# Patient Record
Sex: Male | Born: 1945 | Race: White | Hispanic: No | Marital: Married
Health system: Southern US, Community
[De-identification: ages and names within clinical notes are randomized; demographics above are authoritative.]

## PROBLEM LIST (undated history)

## (undated) DIAGNOSIS — E7800 Pure hypercholesterolemia, unspecified: Secondary | ICD-10-CM

## (undated) DIAGNOSIS — I351 Nonrheumatic aortic (valve) insufficiency: Secondary | ICD-10-CM

## (undated) DIAGNOSIS — G459 Transient cerebral ischemic attack, unspecified: Secondary | ICD-10-CM

## (undated) DIAGNOSIS — F329 Major depressive disorder, single episode, unspecified: Secondary | ICD-10-CM

## (undated) DIAGNOSIS — I10 Essential (primary) hypertension: Secondary | ICD-10-CM

## (undated) DIAGNOSIS — F32A Depression, unspecified: Secondary | ICD-10-CM

## (undated) HISTORY — DX: Nonrheumatic aortic (valve) insufficiency: I35.1

## (undated) HISTORY — DX: Depression, unspecified: F32.A

## (undated) HISTORY — DX: Transient cerebral ischemic attack, unspecified: G45.9

## (undated) HISTORY — DX: Pure hypercholesterolemia, unspecified: E78.00

## (undated) HISTORY — DX: Major depressive disorder, single episode, unspecified: F32.9

## (undated) HISTORY — DX: Essential (primary) hypertension: I10

---

## 2001-12-22 ENCOUNTER — Encounter: Payer: Self-pay | Admitting: Anesthesiology

## 2001-12-29 ENCOUNTER — Encounter: Payer: Self-pay | Admitting: Orthopedic Surgery

## 2001-12-29 ENCOUNTER — Observation Stay (HOSPITAL_COMMUNITY): Admission: RE | Admit: 2001-12-29 | Discharge: 2001-12-30 | Payer: Self-pay | Admitting: Orthopedic Surgery

## 2003-05-17 ENCOUNTER — Emergency Department (HOSPITAL_COMMUNITY): Admission: EM | Admit: 2003-05-17 | Discharge: 2003-05-17 | Payer: Self-pay | Admitting: Emergency Medicine

## 2003-05-21 ENCOUNTER — Ambulatory Visit (HOSPITAL_COMMUNITY): Admission: RE | Admit: 2003-05-21 | Discharge: 2003-05-21 | Payer: Self-pay | Admitting: Neurology

## 2004-08-10 ENCOUNTER — Observation Stay (HOSPITAL_COMMUNITY): Admission: RE | Admit: 2004-08-10 | Discharge: 2004-08-11 | Payer: Self-pay | Admitting: Urology

## 2004-10-09 ENCOUNTER — Ambulatory Visit (HOSPITAL_BASED_OUTPATIENT_CLINIC_OR_DEPARTMENT_OTHER): Admission: RE | Admit: 2004-10-09 | Discharge: 2004-10-09 | Payer: Self-pay | Admitting: Urology

## 2004-10-09 ENCOUNTER — Ambulatory Visit (HOSPITAL_COMMUNITY): Admission: RE | Admit: 2004-10-09 | Discharge: 2004-10-09 | Payer: Self-pay | Admitting: Urology

## 2007-01-08 ENCOUNTER — Ambulatory Visit: Payer: Self-pay | Admitting: Hematology & Oncology

## 2007-01-08 ENCOUNTER — Inpatient Hospital Stay (HOSPITAL_COMMUNITY): Admission: EM | Admit: 2007-01-08 | Discharge: 2007-02-08 | Payer: Self-pay | Admitting: Emergency Medicine

## 2007-01-09 ENCOUNTER — Encounter (INDEPENDENT_AMBULATORY_CARE_PROVIDER_SITE_OTHER): Payer: Self-pay | Admitting: Emergency Medicine

## 2007-01-18 ENCOUNTER — Encounter (INDEPENDENT_AMBULATORY_CARE_PROVIDER_SITE_OTHER): Payer: Self-pay | Admitting: Interventional Cardiology

## 2007-01-27 ENCOUNTER — Ambulatory Visit: Payer: Self-pay | Admitting: Vascular Surgery

## 2007-01-27 ENCOUNTER — Encounter (INDEPENDENT_AMBULATORY_CARE_PROVIDER_SITE_OTHER): Payer: Self-pay | Admitting: Orthopedic Surgery

## 2007-01-31 ENCOUNTER — Ambulatory Visit: Payer: Self-pay | Admitting: Gastroenterology

## 2007-02-08 ENCOUNTER — Ambulatory Visit: Payer: Self-pay | Admitting: Psychiatry

## 2007-03-01 ENCOUNTER — Ambulatory Visit: Payer: Self-pay | Admitting: Hematology & Oncology

## 2007-03-17 LAB — CBC WITH DIFFERENTIAL/PLATELET
Basophils Absolute: 0 10*3/uL (ref 0.0–0.1)
EOS%: 4 % (ref 0.0–7.0)
Eosinophils Absolute: 0.3 10*3/uL (ref 0.0–0.5)
HGB: 13.4 g/dL (ref 13.0–17.1)
MCHC: 34.3 g/dL (ref 32.0–35.9)
MCV: 88.5 fL (ref 81.6–98.0)
MONO#: 0.6 10*3/uL (ref 0.1–0.9)
NEUT#: 4.7 10*3/uL (ref 1.5–6.5)
WBC: 6.4 10*3/uL (ref 4.0–10.0)

## 2007-03-17 LAB — FERRITIN: Ferritin: 1224 ng/mL — ABNORMAL HIGH (ref 22–322)

## 2007-03-17 LAB — CHCC SMEAR

## 2007-05-20 ENCOUNTER — Inpatient Hospital Stay (HOSPITAL_COMMUNITY): Admission: EM | Admit: 2007-05-20 | Discharge: 2007-05-22 | Payer: Self-pay | Admitting: Emergency Medicine

## 2009-10-18 IMAGING — CT CT ABDOMEN W/O CM
2 of 5 series · 16 of 46 positions shown, 18 images · IV contrast (agent unspecified)
Comparison: 01/19/2007 and 01/15/2007

CLINICAL DATA: Alcoholic intoxication with fracture of the right femur.  Repair 01/09/2007.  Continues to have anemia. EGD showed no evidence of bleeding.  Dark stools 02/02/2007. 
ABDOMEN CT WITHOUT CONTRAST:
TECHNIQUE: Multidetector CT imaging of the abdomen was performed following the standard protocol without IV contrast.
TECHNIQUE: Multidetector CT imaging of the pelvis was performed following the standard protocol without IV contrast.

[Series 4: abd/pelv w/o 5.0 b31f st · axial · non-contrast · 0.85mm/px · z∈[-514,-64]mm · 13 of 102 slices shown, 15 images]
[im 6/102  soft-tissue]
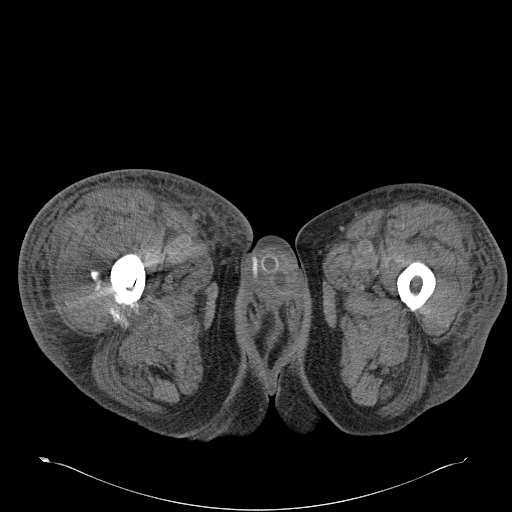
[im 6/102  bone]
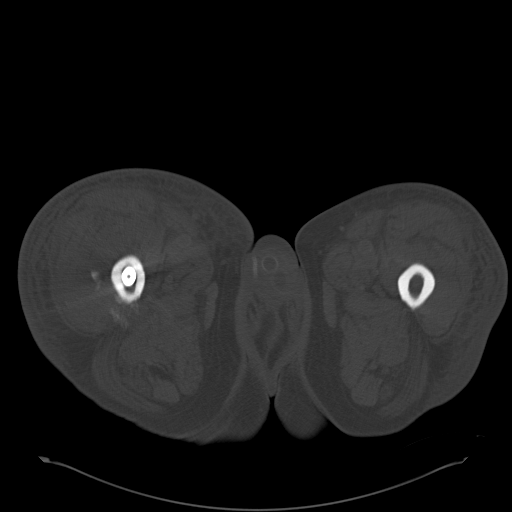
[im 12/102  soft-tissue]
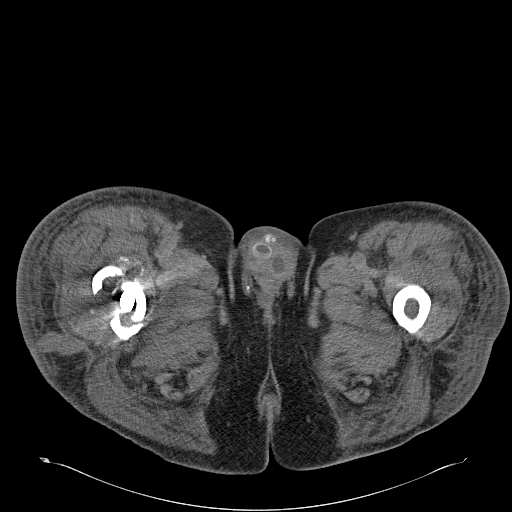
[im 23/102  soft-tissue]
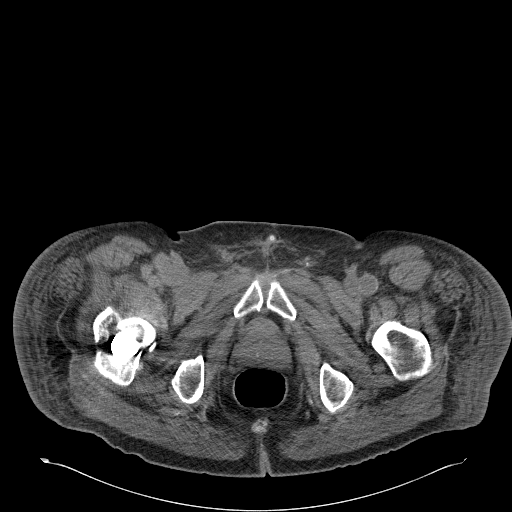
[im 29/102  soft-tissue]
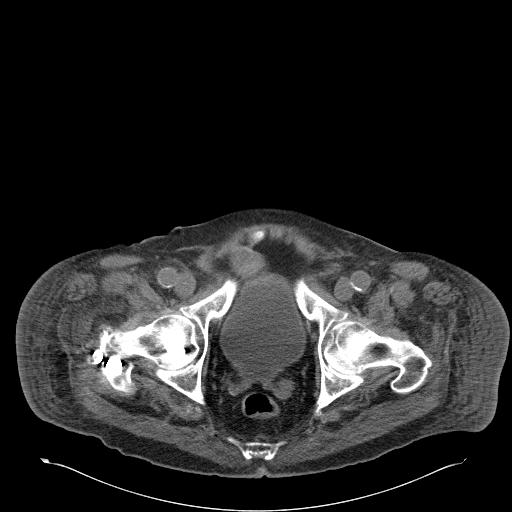
[im 34/102  soft-tissue]
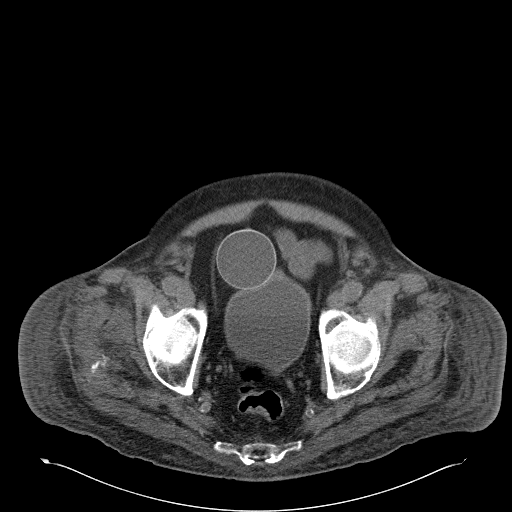
[im 45/102  soft-tissue]
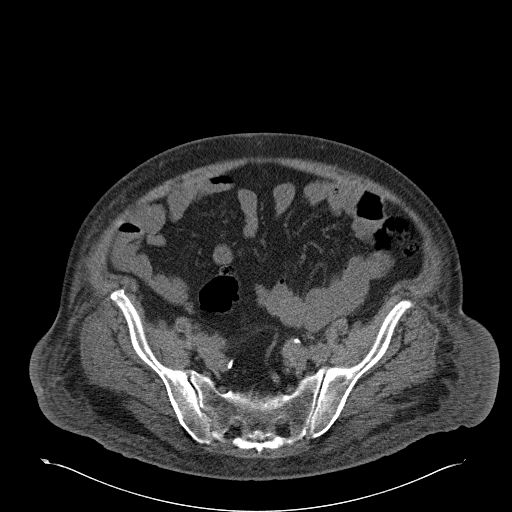
[im 51/102  soft-tissue]
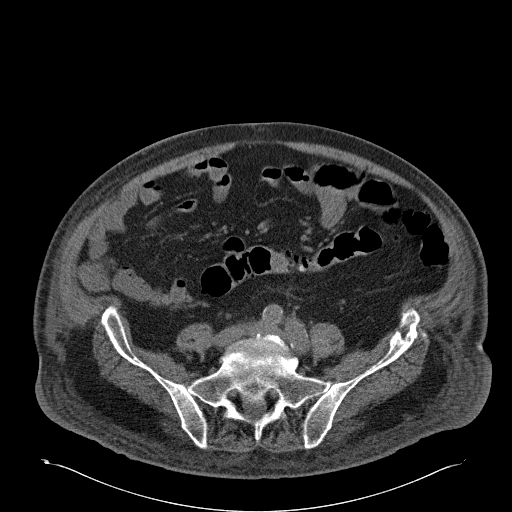
[im 57/102  soft-tissue]
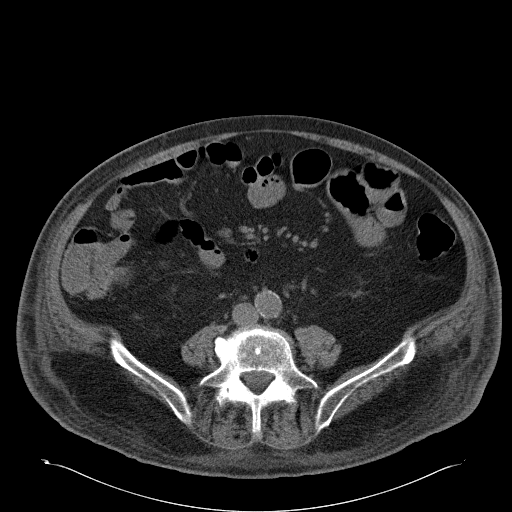
[im 68/102  soft-tissue]
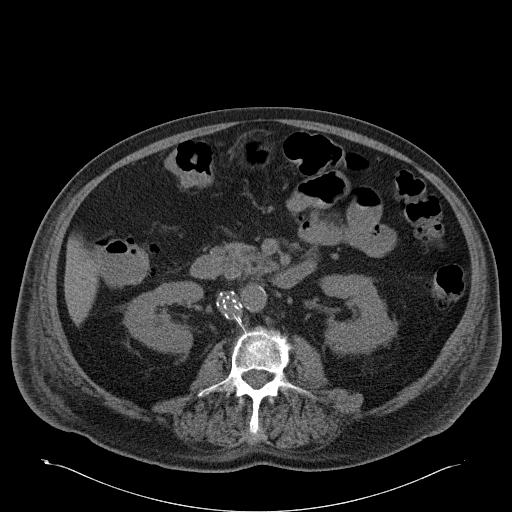
[im 68/102  bone]
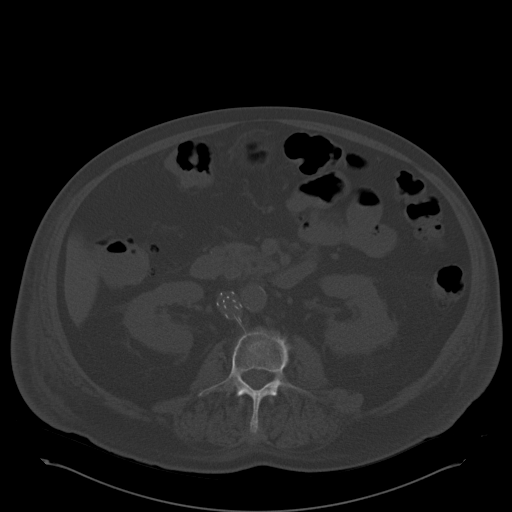
[im 73/102  soft-tissue]
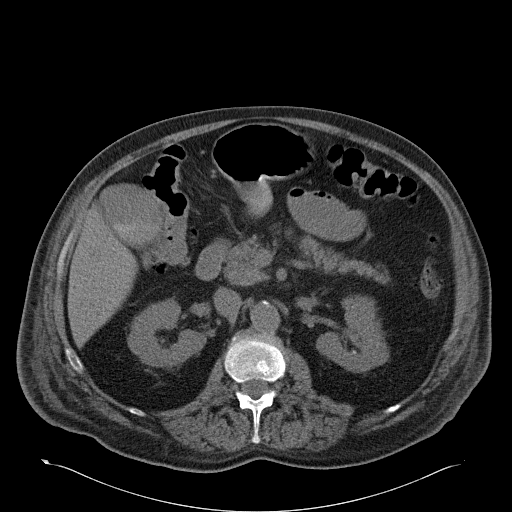
[im 79/102  soft-tissue]
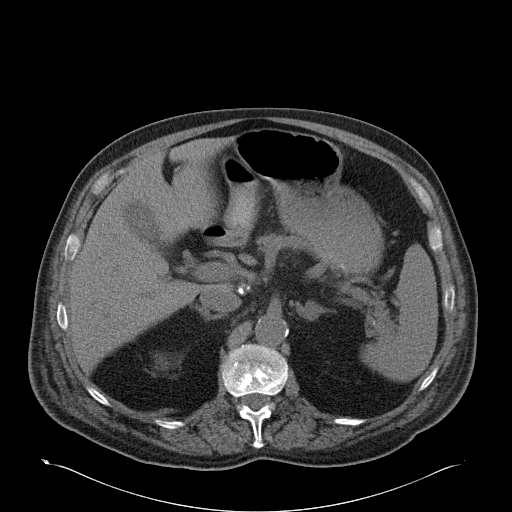
[im 90/102  soft-tissue]
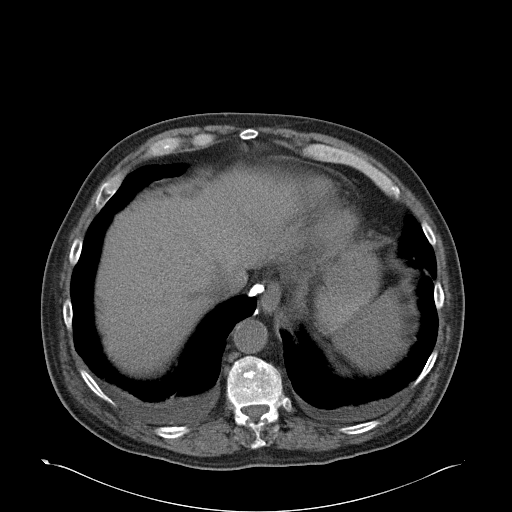
[im 96/102  soft-tissue]
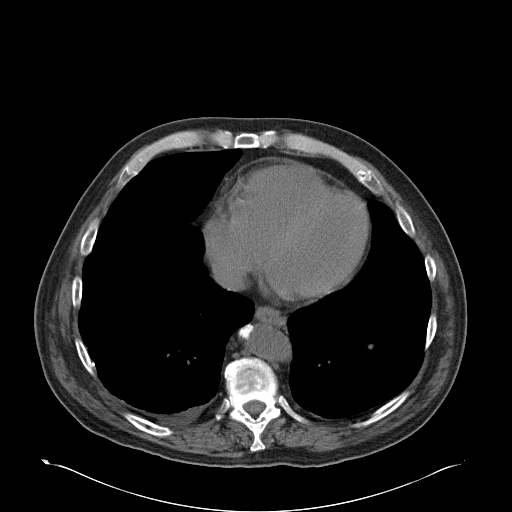

[Series 602: coronal abd/pel · coronal · 1.23mm/px · 3 of 157 slices shown]
[im 53/157  soft-tissue]
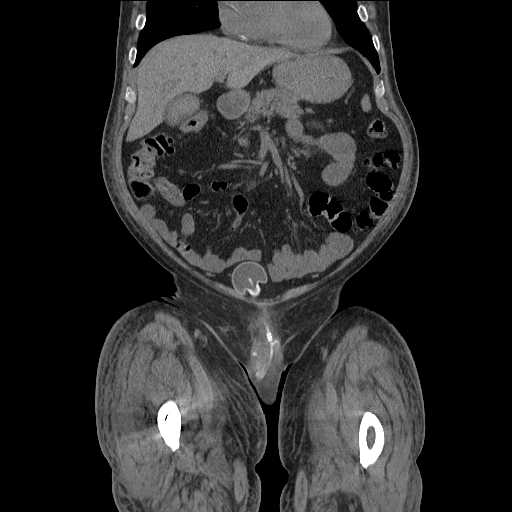
[im 70/157  soft-tissue]
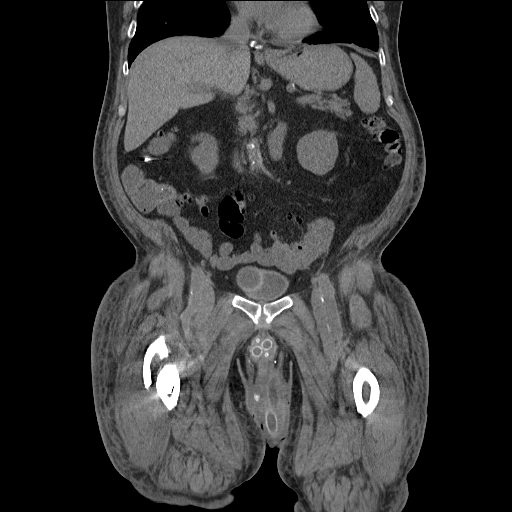
[im 87/157  soft-tissue]
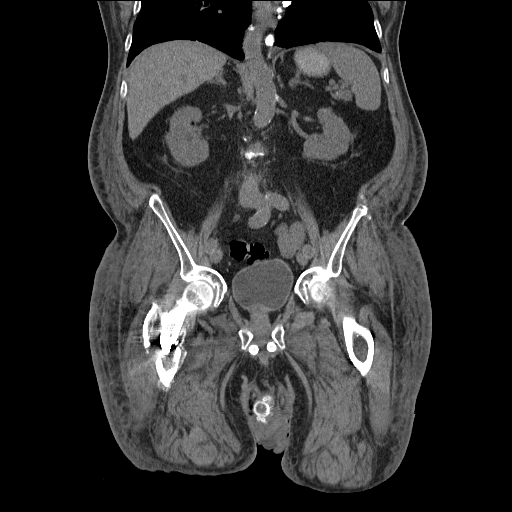

[16 of 46 positions shown; findings below may reference images not displayed]

FINDINGS: Lung bases reveal evidence of prior granulomatous exposure with calcified hilar and mediastinal lymph nodes as well as granulomas.  There are also several small noncalcified nodules, which may represent granulomas.   Pleural thickening with associated calcifications. Stability can be confirmed on followup.  Small bilateral pleural effusions. 
Dense material is located within the gallbladder.  Gallbladder wall thickening cannot be excluded.  If there are any symptoms of primary gallbladder abnormality, ultrasound may be considered for further delineation. Unenhanced imaging of the liver, right adrenal gland, kidneys, spleen, and pancreas are unremarkable.  Nodular prominence left adrenal gland.  Status post inferior vena cava filter placement.  Atherosclerotic-type changes of the aorta. 
Within the stomach, there are two radiopaque structures and within the distal ascending colon is a single linear radiopaque structure.  These were not seen on plain film exam of 01/13/2007 but are seen on plain film examination of 01/31/2007 and are suggestive of ingested material. A call is into the floor/[REDACTED].  
Scattered colonic diverticula.  No findings of bowel obstruction.  Degenerative changes with Schmorl?s node deformities throughout the lumbar spine.  No retroperitoneal or upper abdominal abnormal fluid collection/hematoma.
IMPRESSION: 1.  Two radiopaque structures within the stomach and a single linear radiopaque structure within the distal ascending colon suggestive of ingested material.  The exact etiology is indeterminate.  
2.  Small bilateral pleural effusions and granulomatous changes lung bases as described above.
3.  Dense material within dependent portion of gallbladder.  This may represent sludge.  If there is any clinical suspicion or primary gallbladder abnormality, ultrasound may be considered.
4.  Diverticula without CT evidence of diverticulitis.
5.  No evidence of retroperitoneal hematoma. 
PELVIS CT WITHOUT CONTRAST:
FINDINGS: Penile prosthesis in place.  Fracture right intertrochanteric region treated with open reduction and internal fixation with slight seperation of fracture fragments.  There is some blood surrounding the fracture site but without large hematoma.
IMPRESSION: Postsurgical changes of right femur with some blood surrounding fracture site but without a large hematoma.  Mild third spacing of fluid.

## 2010-06-30 NOTE — Consult Note (Signed)
NAME:  Ernest Donovan, Ernest Donovan NO.:  [id number]   MEDICAL RECORD NO.:  [id number]          PATIENT TYPE:  INP   LOCATION:  1429                         FACILITY:  [facility]   PHYSICIAN:  Mark C. Vernie Ammons, M.D.  DATE OF BIRTH:  10-21-1945   DATE OF CONSULTATION:  05/20/2007  DATE OF DISCHARGE:                                 CONSULTATION   HISTORY OF PRESENT ILLNESS:  Mr. Arvelo is a very pleasant 65 year old  white male patient of Dr. Retta Diones who is seen in hospital consultation  for further evaluation of a complex hydrocele.  The patient had a right  hip fracture and was undergoing physical therapy.  About 2 weeks ago, he  felt discomfort in his left hemiscrotum region and thought he had pulled  something.  He got better, but then reoccurred about a week ago with  some swelling which then resolved and then recurred again this past  Wednesday.  He contacted Dr. Lenoria Chime office and scheduled an  appointment for the following Wednesday, but the pain and swelling  became worse and he ended up coming to the emergency room.  He was seen  there and found to have an elevated white count of 20,000 and a scrotal  ultrasound showed epididymitis with a loculated hydrocele.  I was  contacted regarding the loculated hydrocele.  The pain is moderate to  severe and is worsened by movement and pressure in that location.  Not  associated with any change in his voiding pattern.  He denies dysuria or  hematuria.  He has no prior history of epididymitis or prostatitis.  He  does have a history of erectile dysfunction which has been managed with  an inflatable penile prosthesis that has required revision back in  August 2006.  It is functioning properly at this time.   PAST MEDICAL HISTORY:  1. Hypertension.  2. Recent hip fracture.   SURGICAL HISTORY:  1. Open reduction and fixation of right femoral fracture.  2. Placement of inflatable penile prosthesis with revision of that in  June 2006.   ALLERGIES:  NKDA.   MEDICATIONS:  1. Wellbutrin.  2. Amlodipine.  3. Hydrochlorothiazide.  4. Metoprolol.  5. Ramipril.  6. Topamax.  7. Campral.  8. Naltrexone.   SOCIAL HISTORY:  He is married and has two daughters.  He denies tobacco  use currently, but used to smoke 23 years ago.   FAMILY HISTORY:  Positive for prostate cancer and heart disease.   REVIEW OF SYSTEMS:  As noted above, otherwise, his entire review of  systems is negative.   PHYSICAL EXAMINATION:  VITAL SIGNS:  Blood pressure 100/62, pulse 71,  respiration 18, temperature 98.3.  GENERAL:  The patient is well-developed, well-nourished white male in no  apparent distress in mild distress.  HEENT:  Atraumatic, normocephalic.  Oropharynx clear.  NECK:  Supple with midline trachea.  CHEST:  Reveals normal respiratory effort.  CARDIOVASCULAR:  Regular rate and rhythm.  ABDOMEN:  Soft and nontender without mass or HSM.  He has a normal male  phallus with an inflatable penile prosthesis in place,  it is nontender  and in good position.  The pump as noted in the nearly midline, but just  slightly to the right side in the anterior scrotum.  The left scrotal  wall is mildly erythematous, mildly wet with mild edema.  There is some  slight hydrocele fluid palpable.  Both testicles are palpably normal and  the epididymis on the right is normal.  The epididymis on the left is  markedly tender and enlarged.  He has normal anus and perineum.  His  skin is warm and dry.  EXTREMITIES:  Without clubbing, cyanosis, edema.  He is alert and  oriented with appropriate mood and affect, has no gross focal neurologic  deficits.   LABORATORY DATA:  White blood cell count is 20.2, urinalysis is clear.  Creatinine is 1.02.   Scrotal ultrasound images were reviewed.  The testis appear normal  bilaterally.  The epididymis on the left has increased blood flow and is  enlarged with a loculated hydrocele but no significant  debris is noted  within the hydrocele fluid.   PROCEDURE:  Using sterile technique, anterior aspect of the left  hemiscrotum was cleaned with alcohol and 2% lidocaine was then used to  infiltrate the skin and subcutaneous tissue.  I then advanced a 20 gauge  needle through this into the area that felt like a hydrocele above the  left testicle and was able to draw the small amount of fluid  (approximately 3 mL).  The fluid was amber clear and did not appear  purulent.  It was sent for culture and sensitivity as well as Gram  stain.  This procedure was well tolerated.   IMPRESSION:  1. 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  2. Epididymitis with reactive hydrocele no evidence of pyocele.   PLAN:  1. The patient is currently on intravenous Cipro while awaiting      culture results.  2. The hydrocele fluid was sent for culture and sensitivity,  3. Pain medications as needed.  4. Will follow and assist.      Mark C. Vernie Ammons, M.D.  Electronically Signed     MCO/MEDQ  D:  05/20/2007  T:  05/20/2007  Job:  [id number]

## 2010-06-30 NOTE — Op Note (Signed)
NAME:  Ernest Donovan, Ernest Donovan NO.:  [id number]   MEDICAL RECORD NO.:  [id number]          PATIENT TYPE:  INP   LOCATION:  5003                         FACILITY:  [facility]   PHYSICIAN:  Madlyn Frankel. Charlann Boxer, M.D.  DATE OF BIRTH:  1945-03-02   DATE OF PROCEDURE:  01/09/2007  DATE OF DISCHARGE:                               OPERATIVE REPORT   PREOPERATIVE DIAGNOSIS:  Initially felt to be intertrochanteric femur  fracture with subtrochanteric femur fracture.   POSTOPERATIVE DIAGNOSIS:  Right comminuted subtrochanteric femur  fracture with intertrochanteric extension.   PROCEDURE:  Open reduction internal fixation of right subtrochanteric  femur fracture using a DePuy troch entry Versanail 11 x 44 mm.  Two  proximal screws in the crisscross fashion and one distal interlock.   SURGEON:  Madlyn Frankel. Charlann Boxer, M.D.   ASSISTANT:  None.   ANESTHESIA:  General.   BLOOD LOSS:  300 mL.   DRAINS:  None.   COMPLICATIONS:  None.   INDICATIONS FOR PROCEDURE:  [name] is a 65 year old gentleman who  fell at home off of a counter top onto hard floor, sustained right hip  pain, brought to emergency room where radiographs revealed comminuted  fracture.  He was initially seen and evaluated by Dr. Beverely Low who  asked for my assistance in definitive management.  I reviewed fracture  pattern and discussed fixation techniques.  Risks, benefits discussed.  Consent obtained for this procedure.   PROCEDURE IN DETAIL:  The patient was brought to the operative theater.  Once adequate anesthesia, preoperative antibiotics, Ancef, administered,  the patient was positioned on the fracture table.  The left leg was  flexed and abducted out of way with bony prominence padded, peroneal  post was padded.  The right foot was then placed in the traction shoe.  The fluoroscopic imaging now brought into the field.  Under fluoroscopic  guidance the fracture was identified.  With traction I now identified  this was more of a subtrochanteric sure with intertroch extension.   Initially my plan was to use the trochanter nail based on the initial  diagnosis of this being an intertrochanteric femur fracture.   At this point the right hip was prepped and draped in a sterile fashion.  Landmarks identified under fluoroscopy imaging and the lateral posterior  incision was made.  Guidewire was then centered in the tip of the  trochanter under AP and lateral fluoroscopic imaging.  I was able to  pass a guidewire across the fracture site.  The troch nail had already  been opened and the proximal femur reamed for this.  Once I passed this  nail, I was very uncertain that it would provide definitive fixation  based on the tip of the nail and location of subtrochanteric fracture  pattern.   Given this, I decided not to use this implant.  The implant was removed  over the guidewire to not lose reduction.  I then used a wire passing  tube and exchanged out to a ball-tip guidewire.  This was done under  fluoroscopic imaging to confirm the fracture reduction was  not lost.  At  this point I used flexible reamers and reamed up to a 12.5 reamer from a  10.  I then measured the depth and determined that a 40 cm length nail  would work best.  At this point the new trochanter entry, 11 x 400 mm  nail was passed by hand across the fracture and distal.   At this point under fluoroscopic imaging, I determined the location of  my screw fixation.  I placed one 6.5 cancellous screw into the neck and  another 6.5 cortical screw from the greater trochanter to the lesser  trochanter.  With these two screws in place and confirmed location  in  AP and lateral planes, I  placed the distal interlock with the perfect  circle technique.  Final radiographs were obtained.  The jig was removed  off the proximal femur.  All wounds were irrigated the proximal wound  was closed in layers reapproximating gluteal fascia with #1  Vicryl, 2-0  Vicryl was used in the subcu layer and the remaining wounds.  Staples  were used on the skin.  The skin was cleaned, dried and dressed  sterilely with dressing sponges and ABD proximally due to some of the  bleeding that was present from ooze.  Tape was applied.  At this point  the patient was awoken from anesthesia and brought to recovery room in  stable condition.      Madlyn Frankel Charlann Boxer, M.D.  Electronically Signed     MDO/MEDQ  D:  01/09/2007  T:  01/10/2007  Job:  [id number]

## 2010-06-30 NOTE — Discharge Summary (Signed)
NAMERIELY, BASKETT NO.:  [id number]   MEDICAL RECORD NO.:  [id number]          PATIENT TYPE:  INP   LOCATION:  5023                         FACILITY:  [facility]   PHYSICIAN:  Madlyn Frankel. Charlann Boxer, M.D.  DATE OF BIRTH:  25-Feb-1945   DATE OF ADMISSION:  01/08/2007  DATE OF DISCHARGE:  02/08/2007                               DISCHARGE SUMMARY   This is an addendum to a most recent discharge summary that was last  dictated on the 19th of December.   After the 19th of December, it was determined, based on continued subtle  drops of his hemoglobin and a drop of his hemoglobin and hematocrit down  to 21.1, that the patient would stay for 2 units of blood.  On October 05, 2006, his blood was rechecked after receiving 2 units of blood, and  it was noted at that point that his hematocrit remained the same.  His  hemoglobin went up 1 point the day before, and the next day it came  down.   With this drop on the 19th, GI was reconsulted.  They did a repeat  endoscopy, showing no evidence of any bleeding actively.  They  recommended a bleeding consult.  On February 06, 2007, I had also  consulted hematology for any recommendations they may have.  They made  recommendations for removing the oral iron and starting him on IV iron.  Mr. Shahin also reported to me significant acute exacerbation of his  depression due to his prolonged hospital stay.  I had psychiatry come  by, who was kind enough to make some acute recommendations.   Again, on rechecking his labs on February 06, 2007, he had a hematocrit  of 26.6.  On February 06, 2007, his hemoglobin was 8.6, down from 9, and  has basically remained at that level since.   He had an upper endoscopy and a colonoscopy performed by GI on February 07, 2007 that was reportedly negative for any pathology.  No evidence of  active bleeding.  The bleeding scan that had been done before was  negative.   At this point, the patient is ready  for discharge.   I still think that Mr. Alesi is in significant need of rehabilitation  stay.  He has had four weeks of complete deconditioning with both upper  and lower extremity daily activities.  Though he is able to function and  walk a little bit, he is unable to climb stairs.  He does not have  control over his right lower extremity.  He has become very weak and  deconditioned with regards to activities of daily living of dressing,  bathing, and toileting, getting out of the bed easily.  Being in a  facility would also allow for potential evaluation and monitoring of his  medical issues, including history of pulmonary embolus now,  postoperative ileus, hemoglobin levels, and consistent food intake,  including protein due to very low levels of albumin and prealbumin.   DISCHARGE INSTRUCTIONS:  1. He will continue to be touchdown weightbearing on the right lower  extremity with 25% weightbearing.  He will be using a walker until      followup in the orthopedic office.  2. Orthopedically, he will follow up at [facility] at [phone]      [phone] for radiographs.  3. It is recommended that he follow up with Dr. Arlyce Dice at [facility].  They will follow up in a couple of weeks after      discharge.  4. As for medical physicians, he is to try to schedule an appointment      with [facility] for some long-term care.   DISCHARGE MEDICATIONS:  Include those previously listed.  In addition,  it will be encouraged that he is to take in protein for nutrition  purposes.  1. Medications also include folic acid 2 mg daily.  2. From an orthopedic standpoint, his medications would be Vicodin,      used at a minimum basis, 5/500, 1 tablet every 6 hours as needed      for pain, supplemented with Tylenol.  3. Robaxin 500 mg q.i.d. p.r.n. muscle spasms and pain.  4. From a psychiatric standpoint, I would recommend he go home on his      home medications with plans  to make arrangements and follow up with      his primary psychiatrist for continued monitoring of his current      state and possible need for adjustment of medications.  5. Other specific medications include hydrochlorothiazide 25 mg p.o.      daily.  6. Norvasc 5 mg p.o. daily.  7. Topamax 100 mg p.o. daily.  8. Metoprolol 50 mg p.o. daily.  9. Campral 330 mg p.o. t.i.d.  10.Multivitamins p.o. daily.  11.Thiamine 100 mg p.o. daily.  12.Protonix 40 mg p.o. b.i.d.   His wound has completely healed over the right hip.  He is free to  shower or bathe without difficulty or concern.  No staples need to be  removed.      Madlyn Frankel Charlann Boxer, M.D.  Electronically Signed     MDO/MEDQ  D:  02/08/2007  T:  02/08/2007  Job:  [id number]   cc:   Barbette Hair. Arlyce Dice, MD,FACG  Rose Phi. Myna Hidalgo, M.D.  [employer]

## 2010-06-30 NOTE — Discharge Summary (Signed)
NAMECHANTZ, MONTEFUSCO NO.:  [id number]   MEDICAL RECORD NO.:  [id number]          PATIENT TYPE:  INP   LOCATION:  5023                         FACILITY:  [facility]   PHYSICIAN:  Madlyn Frankel. Charlann Boxer, M.D.  DATE OF BIRTH:  [birth year]   DATE OF ADMISSION:  01/08/2007  DATE OF DISCHARGE:                               DISCHARGE SUMMARY   ADDENDUM:  Original planned discharge was on January 27, 2007.  Discharge medications will be adjusted a the end of the note.   On the evening of January 26, 2007, and then the morning of January 27, 2007, Mr. Onis Markoff had several bouts of nausea and emesis that  had obvious findings of blood mixed in with dark blood.  He was taken  rather urgently, due to a drop in his blood pressure to the Intensive  Care Unit.   In the Intensive Care Unit GI was reconsulted for evaluation.  He was  stabilized without any cardiac event or major complicating factors.  He  was taken to Endoscopy where no obvious bleeding or source of problems  was identified.   NG tube was replaced and maintained.  Given the fact that no active  bleeding was identified, he was taken back down to Endoscopy on January 27, 2007, where they were able to identify a small ulceration in the  proximal portion of the stomach.   At the time of his transfer to the Intensive Care Unit labs were ordered  including a D-dimer which was elevated.  This value led [place] to evaluate  his lungs with a pulmonary embolus with a CT scan.  There were findings  of a lower lobe pulmonary embolus.   Given the findings of the ulcer, the drop in his hematocrit, the  vomiting of blood in addition to the findings concerning for PE in the  lower lobe, he was taken off of his DVT prophylaxis at this point and a  temporary vena cava filter placed by Interventional Radiology.   He was further stabilized in the Intensive Care Unit where he remained  until he was transferred out of the Intensive  Care Unit January 30, 2007.  Please note that a third endoscopy was performed on January 29, 2007, after one more episode of some emesis; they were able to find an  ulcer that was coagulated and stapled off.   Physical therapy was reconsulted for touch down weight bearing on the  right lower extremity for mobility.   He was transferred to the floor on January 30, 2007, where he remained  stable, GI continued to follow.  They had signed off on the 15th with  plans to follow up with Dr. Arlyce Dice.  They kept him in the hospital over  the next few days where we continued to watch his hematocrit drop down  slowly.  He also had another bout of nausea and vomiting small amounts  of dark material.  GI was reconsulted and he was set up for an endoscopy  on February 02, 2007.  At that endoscopy there was no significant  findings, all previous treatment was identified with no active bleeding.   His hematocrit on February 03, 2007, was down to 21.1.  Based on all of  the findings and the duration out from his hip fracture, which is now  over 3 weeks, the plan was for him to receive 2 units of packed red  blood cells and possibly be transferred to [facility] for rehab with  plans for outpatient management.   Subjectively, Mr. Danese was feeling much better on the morning of  February 03, 2007, he had no complaints of abdominal discomfort, no  nausea.  His hip at this point remained stable without new events.  Radiographs had been ordered, since his time of previous discharge,  revealing stable hardware with no evidence of obvious healing, however  no complicating features.   DISCHARGE PLAN:  1. He will be discharged to [facility] for rehabilitation      purposes.  He will continue to be touch down weight bearing with      physical therapy until further directed from my office.  He will      need follow up with me in 2 weeks which can be scheduled by calling      [phone].  At  this point, he should be seen probably around the      first week of January.  2. Should also have follow up with Gastroenterology (Dr. Melvia Heaps), with [facility] in 1 week, this appointment will need to      be made.  At that point issues regarding continued hematocrit drop,      potential lower GI involvement can be reviewed.   DIET:  A regular diet.   DISCHARGE MEDICATIONS:  1. Will be as previously listed.  2. Please note that he was on Reglan in the hospital but that would be      discharged.  3. From a hip standpoint, he will be on Vicodin 1-2 tablets p.o. q.4-      6h. p.r.n. for pain, Robaxin 500 mg p.o. q.i.d. p.r.n. for muscle      spasms and pain.  Otherwise, his medications are unchanged.      Madlyn Frankel Charlann Boxer, M.D.  Electronically Signed     MDO/MEDQ  D:  02/03/2007  T:  02/03/2007  Job:  [id number]   cc:   Barbette Hair. Arlyce Dice, MD,FACG

## 2010-06-30 NOTE — Discharge Summary (Signed)
Ernest Donovan, Ernest Donovan NO.:  [id number]   MEDICAL RECORD NO.:  [id number]          PATIENT TYPE:  INP   LOCATION:  6707                         FACILITY:  [facility]   PHYSICIAN:  Madlyn Frankel. Charlann Boxer, M.D.  DATE OF BIRTH:  01/31/1946   DATE OF ADMISSION:  01/08/2007  DATE OF DISCHARGE:  01/27/2007                               DISCHARGE SUMMARY   ADMISSION DIAGNOSES:  1. Right comminuted subtrochanteric femur fracture.  2. History of hypertension.  3. History of bicuspid valve.  4. History of depression.  5. Prior alcohol use.   HISTORY OF PRESENT ILLNESS:  The patient is a 65 year old gentleman who  was home on the evening of January 08, 2007, trying to capture a bug  that was on the ceiling.  He was standing on a marble countertop,  slipped in socks and fell, first on the counter top and then on the  floor.  He had immediate onset of pain, inability to bear weight.  EMS  was called and the patient was brought to the emergency room where  radiographs revealed a subtrochanteric femur fracture.  The patient was  initially seen and evaluated by one of my partners, Almedia Balls. Ranell Patrick,  M.D., who admitted him.  He discussed the case with me for definitive  management.  He was seen and evaluated by [facility] as a  preoperative consultation and evaluation.  They recommended cardiac  evaluation based on the history of bicuspid valve and murmur and this  was obtained.   HOSPITAL COURSE:  The patient was admitted on January 08, 2007, with  right subtrochanteric femur fracture.  At that time he did have a blood  alcohol level of 61.   He was seen and evaluated by [facility] followed by cardiology  evaluation.  On January 09, 2007, the patient was cleared for surgery  after having an echocardiogram.   On January 09, 2007, he did undergo an open reduction and internal  fixation of his right subtrochanteric femur fracture.  This was an  uncomplicated procedure,  only noted for a significant amount of  narcotic/Fentanyl that was required to keep him asleep.   Immediately postoperatively he was transferred to the orthopedic floor  where he remained stable.  Routine postoperative care was initiated with  physical therapy with touchdown weightbearing based on his fracture  pattern.  He was placed on Lovenox for DVT prophylaxis immediately  postoperatively, however, thus had some issues regarding oozing on the  lateral thigh.   As his hospital stay progressed, by postoperative day #4, he was doing  fairly well.  He continued to have some serous drainage out of his  thigh.  We were doing dressing changes in b.i.d. basis.  By  postoperative day #5 it was noted that he was having some reports of  nausea and vomiting over the night.  His abdomen was distended and he  was noted to have decreased bowel sounds.  He was also noted to have a  bump in his creatinine from a presentation of 0.8 up to 1.3.   His hematocrit remained  stable throughout the hospital stay and  incidentally noted to have this bump in his creatinine at that time.   Given the findings on this day, which is now postoperative day #5, an NG  tube was placed removing significant amount of gastric contents.  Radiographically, he was diagnosed as having an ileus versus partial  small bowel obstruction.  A Foley catheter was placed to monitor his  urine output.  His IV fluids were increased due to presumed hypovolemic  acute renal insufficiency.   He was still managed and followed on the orthopedic ward until  postoperative day #6 when he had a drop in his saturations and blood  pressure.  He was subsequently transferred down to the intensive care  unit where he was in stepdown.   There was a concern about cardiac event.  Cardiology was consulted.  His  cardiac enzymes were negative for an acute myocardial infarction.  There  was no further studies and no catheterization done from a  cardiac  standpoint.   At this time also the general surgeons were consulted due to the  persistent ileus versus partial small bowel obstruction.  We appreciated  their consultation and following during his time in the unit.  He  remained in the unit from November 30 until January 21, 2007.  At that  time he was transferred up to 6700 at [facility]  He continued  to be followed by nutrition as TNA was initiated with a PICC line due to  his prolonged ileus and continued use of NG tube.  TNA was initiated on  January 20, 2007.   Through the assistance of general surgery and their daily following, it  was recognized that NG tube may have been a little bit lower than it  needed to be down into the duodenum versus gastric.  He was also started  on some Reglan.  The combination of these events indicated that the NG  tube could be removed and after clamped for a while, it was removed on  January 21, 2007.  At that point TNA was discontinued.  He tolerated the  NG being out of his stomach and having positive bowel movements, flatus,  and tolerating a progressive diet.  By January 26, 2007, the patient's  wound had healed, staples were going to be discontinued.  He had been  continued with physical therapy for touchdown weightbearing due to the  unstable nature of his fracture pattern.   The plan on January 26, 2007, was for him to be discharged to a nursing  facility on January 27, 2007, due to the fact that his daughter is an  occupational therapist in [facility], they wished to be transferred  down to that area.   DISCHARGE INSTRUCTIONS:  The patient will be discharged to be touchdown  weightbearing with physical therapy and occupational therapy to progress  to work on upper extremity strength, lower extremity strength in an  effort to be able to return back to his normal home environment that  does require stairs.  He will be on a regular diet monitoring his  bowels.   Prior to discharge, he was transitioned over to oral pain  medications.   DISCHARGE MEDICATIONS:  1. Carypral 330 mg p.o. t.i.d.  2. Naltrexone 50 mg p.o. daily.  3. Budeprion XL 150 mg three tablets q.a.m.  4. Hydrochlorothiazide 25 mg daily.  5. Norvasc 5 mg daily.  6. Ramipril 10 mg b.i.d.  7. Topamax 100 mg daily.  8. Metoprolol 50 mg daily.  9. Aspirin 325 mg enteric coated p.o. daily for four weeks.  10.Vicodin one to two tablets p.o. q.4-6 hours p.r.n. pain.  11.Robaxin 500 mg p.o. q.i.d. p.r.n. for spasm and pain.  12.Please note that some of his medications are narcotic antagonist,      therefore narcotics may not provide that much relief and he may get      more benefit from the combination of Tylenol 650 mg p.o. q.4-6      hours p.r.n. for pain.  13.Colace 100 mg p.o. b.i.d. p.r.n. for constipation.  14.MiraLax 17 grams p.o. daily p.r.n. for constipation.   I would need to see him in the office in two weeks from discharge which  would be on and around the Christmas Holiday and this appointment can be  set up through the nursing facility for him to come back to see me at 2-  3 weeks at office number [phone].   DISCHARGE DIAGNOSES:  1. Right subtrochanteric femur fracture.  2. Postoperative prolonged ileus due to narcotic use.  3. Hypertension.  4. Depression.  5. History of alcohol use.  6. History of bicuspid valve.   Staples were removed prior to discharge.  The wound had been dry and  stable with no signs of infection.      Madlyn Frankel Charlann Boxer, M.D.  Electronically Signed     MDO/MEDQ  D:  01/26/2007  T:  01/26/2007  Job:  [id number]

## 2010-06-30 NOTE — Op Note (Signed)
NAME:  Ernest Donovan, Ernest Donovan NO.:  [id number]   MEDICAL RECORD NO.:  [id number]          PATIENT TYPE:  INP   LOCATION:  2304                         FACILITY:  [facility]   PHYSICIAN:  Anselmo Rod, M.D.  DATE OF BIRTH:  July 18, 1945   DATE OF PROCEDURE:  01/29/2007  DATE OF DISCHARGE:                               OPERATIVE REPORT   PROCEDURE PERFORMED:  Esophagogastroduodenoscopy with control of  bleeding.   ENDOSCOPIST:  Anselmo Rod, M.D.   INSTRUMENT USED:  Pentax video panendoscope.   INDICATIONS FOR PROCEDURE:  65 year old white male status post right hip  fracture.  The patient had open reduction internal fixation and  subsequently developed a pulmonary embolus and an upper GI bleed.  Two  previous endoscopies have not been able to locate the source of bleeding  as there was a large amount of clots in the high cardia.   PREPROCEDURE PREPARATION:  Informed consent was procured from the  patient and the family after the risks and benefits of the procedure  were discussed with the patient in great detail.  Serial CBCs were done  and the patient's vital signs were closely monitored in ICU setting.  The patient was on IV Protonix.   PREPROCEDURE PHYSICAL:  The patient had stable vital signs.  NECK:  Supple.  Chest clear to auscultation.  S1, S2 regular.  Abdomen soft  with normal bowel sounds.  There is minimal epigastric tenderness on  palpation with no guarding, rebound or rigidity.   DESCRIPTION OF PROCEDURE:  The patient was placed in the left lateral  decubitus position and sedated with 75 mcg of Fentanyl and 5 mg of  Versed given intravenously in slow incremental doses.  Once the patient  was adequately sedated and maintained on low-flow oxygen and continuous  cardiac monitoring.  The Pentax video panendoscope was advanced through  the mouthpiece over the tongue into the esophagus under direct vision.  The entire esophagus was widely patent with no  evidence of ring,  stricture, mass, esophagitis or Barrett's mucosa.  The scope was then  advanced in the stomach.  Some NG trauma was noted in the midbody of the  stomach where some erythematous spots were recognized. A visible vessel  was noted in the high cardia along the greater curvature and three  resolution clips were applied over the vessel to achieve hemostasis  after injecting 10 cc of epinephrine around the visible vessel. There  was a small area of ulceration just proximal to the visible vessel which  may also be secondary to NG trauma.  The antrum and the mid body  appeared normal.  Proximal small bowel appeared normal as well.  One of  the resolution clips that were applied did not take but three of the  four seemed to hold well at the site of bleeding.  The patient tolerated  the procedure well without complications.   IMPRESSION:  1. Normal-appearing esophagus.  2. 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  Three      resolution clips applied to achieve hemostasis after injecting 10  mL of epinephrine around the visible vessel.  3. Evidence of NG trauma.  4. Normal proximal small bowel.   RECOMMENDATIONS:  1. Continue serial CBCs.  2. Avoid all nonsteroidals for now.  3. Change Protonix to p.o.  4. Begin a clear liquid diet.  5. Adherence to antireflux measures have been emphasized and discussed      with the patient.  6. Further recommendations made in follow-up.      Anselmo Rod, M.D.  Electronically Signed     JNM/MEDQ  D:  01/29/2007  T:  01/30/2007  Job:  [id number]   cc:   Madlyn Frankel Charlann Boxer, M.D.  Rachael Fee, MD

## 2010-06-30 NOTE — Consult Note (Signed)
NAME:  Ernest Donovan, Ernest Donovan NO.:  [id number]   MEDICAL RECORD NO.:  [id number]          PATIENT TYPE:  INP   LOCATION:  3304                         FACILITY:  [facility]   PHYSICIAN:  Corky Crafts, MDDATE OF BIRTH:  1945-09-06   DATE OF CONSULTATION:  01/17/2007  DATE OF DISCHARGE:                                 CONSULTATION   REASON FOR CONSULTATION:  Abnormal cardiac enzymes, question myocardial  infarction, tachycardia, chest pain, bicuspid aortic valve.   HISTORY OF PRESENT ILLNESS:  The patient is a 65 year old man who had a  hip fracture.  He underwent surgery.  Since surgery, he had had some  problems with hypotension and subsequent tachycardia.  He has also had  some renal insufficiency secondary to dehydration.  The dehydration was  likely caused by diarrhea.  He also had some left chest wall pain, and  cardiac enzymes revealed a troponin of 0.12. Marland Kitchen There is now a question  as to whether he has had a myocardial infarction or underlying coronary  artery disease.   Currently, the patient feels well.  He does not report any chest pain or  shortness of breath.  He has not had any further palpitations.  Overall,  he feels fairly well.  His leg is improving.   ALLERGIES:  No known drug allergies.   HOME MEDICATIONS:  Norvasc, hydrochlorothiazide, Toprol XL 50 mg daily,  Wellbutrin, Topamax, Campral, and multivitamin.   SOCIAL HISTORY:  The patient does drink alcohol occasionally, he does  not smoke.   FAMILY HISTORY:  His father had coronary artery disease.   PAST MEDICAL HISTORY:  Hypertension, bicuspid valve, depression, prior  alcohol use.   PAST SURGICAL HISTORY:  Recent hip surgery.   REVIEW OF SYSTEMS:  He has had diarrhea, he has had palpitations and  chest pain recently.  He has had leg pain.  She has had a fever and is  currently denying any shortness of breath.  All other systems negative.   PHYSICAL EXAMINATION:  VITAL SIGNS:  Blood  pressure is 120/63, heart  rate 100, respiratory rate 18.  GENERAL:  He is awake, alert, and in no apparent distress.  HEENT:  Head normocephalic, atraumatic.  Eyes:  Extraocular movements  are intact.  NECK:  No JVD, no carotid bruits.  CARDIOVASCULAR:  Regular rate and rhythm.  S1 and S2.  LUNGS:  Clear to auscultation bilaterally.  ABDOMEN:  Nondistended.  Nontender.  No mass.  EXTREMITIES:  No edema.  NEUROLOGIC:  Alert and oriented x3.  PSYCHIATRIC:  Normal mood and affect.   LABORATORY DATA:  Shows CK of 315, MB 3.0, troponin 0.04, subsequent CK  277, MB 2.5, troponin 0.12, C. difficile negative.  Creatinine 1.1,  although it was as high as 1.5.  EKG shows sinus tachycardia with right  bundle branch block.  There is some T-wave inversion anteriorly.   PLAN:  A 65 year old with abnormal cardiac enzymes and valvular heart  disease, along with hypertension and renal insufficiency.   1. Continue aggressive hydration to help with the renal insufficiency.  This could have contributed to his elevated troponin.  2. I will check an echocardiogram to evaluate LV function.  Given the      MB fraction, I doubt that this represents ruptured plaque. I would      not feel the need to anticoagulate this patient with heparin      because of his cardiac enzymes.  3. Continue to follow electrolytes, given his diarrhea.  4. Once he is improved from his hip surgery, I would consider      performing an outpatient stress test, or possibly catheterization      if he has symptoms.  5. His blood pressure is currently under control with medicines being      on hold given low blood pressures.  6. I will follow the patient while he is in the hospital.      Corky Crafts, MD  Electronically Signed     JSV/MEDQ  D:  01/17/2007  T:  01/18/2007  Job:  [id number]

## 2010-06-30 NOTE — Discharge Summary (Signed)
NAMEDIONTAE, ROUTE NO.:  [id number]   MEDICAL RECORD NO.:  [id number]          PATIENT TYPE:  INP   LOCATION:  5023                         FACILITY:  [facility]   PHYSICIAN:  Madlyn Frankel. Charlann Boxer, M.D.  DATE OF BIRTH:  1945/05/16   DATE OF ADMISSION:  01/08/2007  DATE OF DISCHARGE:                               DISCHARGE SUMMARY   ADDENDUM   ADMITTING DIAGNOSIS:  Right subtrochanteric femur fracture.   DISCHARGE DIAGNOSES:  1. Right subtrochanteric femur fracture comminuted.  2. Upper gastrointestinal bleeding with consultations with general      surgery and gastroenterology.  3. Postoperative ileus.  4. Hypertension.  5. Depression.  6. History of alcohol use.   CONSULTATIONS:  Dr. Eldridge Dace, Dr. Arlyce Dice, Dr. Daphine Deutscher and Dr. Jamey Ripa from  [facility].      Madlyn Frankel Charlann Boxer, M.D.  Electronically Signed     MDO/MEDQ  D:  02/03/2007  T:  02/03/2007  Job:  [id number]

## 2010-06-30 NOTE — H&P (Signed)
NAME:  Ernest Donovan, Ernest Donovan NO.:  [id number]   MEDICAL RECORD NO.:  [id number]          PATIENT TYPE:  INP   LOCATION:  0104                         FACILITY:  [facility]   PHYSICIAN:  Kela Millin, M.D.DATE OF BIRTH:  March 15, 1945   DATE OF ADMISSION:  05/20/2007  DATE OF DISCHARGE:                              HISTORY & PHYSICAL   PRIMARY CARE PHYSICIAN:  Dr. Henrine Screws.   UROLOGIST:  Dr. Retta Diones.   CHIEF COMPLAINT:  Testicular swelling and pain - left greater than  right.   HISTORY OF PRESENT ILLNESS:  The patient is a 65 year old white male  with past medical history significant for a penile implant,  hypertension, depression, history of bicuspid valve and prior history of  alcohol abuse, also history of hip fracture and while at rehab following  hospitalization he was diagnosed with E. coli bacteremia treated with  antibiotics in February 19, 2007.  He presents with the above complaints.  He states that he was in his usual state of health until about 2 weeks  ago when he first noted some scrotal swelling.  After a couple of days  that resolved, but 3 days ago the swelling began again and this time it  was worse than previously and also, he was having a lot of pain.  He  states that initially he thought that it was just irritation from riding  the bike at his rehab that he has continued outpatient, but with the  worsening of his symptoms he decided to come to the ER.  He denies  dysuria, abdominal pain, fevers, cough, diarrhea, melena, and no  hematochezia.   He was seen in the ER and a scrotal ultrasound was done which revealed  left epididymo-orchitis with a complex left hydrocele and also a small  and simple-appearing right hydrocele.  His blood pressure was noted to  be low at 88/51 in the ER but improved with hydration to 100/62.  His  white cell count elevated at 20.2.  He is admitted for further  evaluation and management.   PAST MEDICAL HISTORY:   As above.   MEDICATIONS:  1. Wellbutrin 150 mg three tablets q.a.m.  2. Norvasc 5 mg daily.  3. Hydrochlorothiazide 25 mg daily.  4. Metoprolol 25 mg b.i.d.  5. Campral 333 mg t.i.d.  6. Ramipril 10 mg b.i.d.  7. Topamax 100 mg daily.  8. Naltrexone 50 mg daily.   ALLERGIES:  No known drug allergies.   SOCIAL HISTORY:  He states he has not had any alcohol since January 08, 2007.  He quit tobacco in 1986.   FAMILY HISTORY:  His father had prostate cancer and also coronary artery  disease.   REVIEW OF SYSTEMS:  As per HPI, other review of systems negative.   PHYSICAL EXAMINATION:  GENERAL:  He is an elderly white male in no  apparent distress.  VITAL SIGNS:  His temperature is 98.3, blood pressure 100/62, low of  88/51 noted in the ER, pulse of 71, respiratory rate 18, O2 saturation  of 95%.  HEENT:  PERRL, EOMI, slightly dry  mucous membranes, no oral exudates.  NECK:  Supple, no adenopathy, no thyromegaly and no JVD.  LUNGS:  Clear to auscultation bilaterally.  No crackles or wheezes.  CARDIOVASCULAR:  Regular rate and rhythm.  Normal S1, S2.  ABDOMEN:  Soft, bowel sounds present, nontender, nondistended.  No  organomegaly and no masses palpable.  GU:  Testicular swelling present, left greater than right, and scrotal  erythema noted, also testicular tenderness left greater than right.  He  has a penile implant present.  EXTREMITIES:  No cyanosis and no edema.  NEURO:  He is alert and oriented x3.  Cranial nerves II-XII grossly  intact.  Nonfocal exam.   LABORATORY DATA:  Scrotal ultrasound as per HPI.  His white cell count  is 20.2, hemoglobin 13.8, hematocrit 40.2, platelet count is 203,  neutrophil count is 90.  His sodium is 133 with a potassium of 3.3,  chloride 101, CO2 is 22, glucose 117, BUN of 14. Creatinine 1.02,  calcium 8.8.  Urinalysis is negative for infection.   ASSESSMENT AND PLAN:  1. Left epididymo-orchitis with complex left hydrocele and sepsis       syndrome - will obtain blood cultures, empiric antibiotics.  As      noted above he also has a penile implant and is followed by Dr.      Retta Diones.  I have consulted urology and Dr. Vernie Ammons to see the      patient.  2. Hypotension - likely secondary to infection.  Blood cultures as      above, IV fluids, hold antihypertensives for now and follow.  3. Hypokalemia - replace potassium.  4. Hyponatremia, mild - likely secondary to volume depletion.  We will      hold hydrochlorothiazide, hydrate and follow.  5. History of hypertension - as above, will hold antihypertensives      secondary to number #2 and follow.  6. History of depression - continue outpatient medications.  7. History of hip fracture - follow and consult physical therapy.      Kela Millin, M.D.  Electronically Signed     ACV/MEDQ  D:  05/20/2007  T:  05/20/2007  Job:  [id number]   cc:   Chales Salmon. Abigail Miyamoto, M.D.  Fax: [phone]   Bertram Millard. Dahlstedt, M.D.  Fax: [phone]

## 2010-11-10 LAB — BASIC METABOLIC PANEL
CO2: 22
CO2: 24
CO2: 25
Calcium: 8.5
Calcium: 8.9
Chloride: 101
Chloride: 102
Creatinine, Ser: 0.77
Creatinine, Ser: 0.86
Creatinine, Ser: 1.02
GFR calc Af Amer: >60
GFR calc Af Amer: >60
GFR calc non Af Amer: >60
Glucose, Bld: 104 — ABNORMAL HIGH
Glucose, Bld: 96
Potassium: 3.3 — ABNORMAL LOW
Sodium: 133 — ABNORMAL LOW
Sodium: 139

## 2010-11-10 LAB — CBC
HCT: 40.2
Hemoglobin: 12.3 — ABNORMAL LOW
Hemoglobin: 13.8
MCHC: 34.3
MCHC: 35.1
MCHC: 35.5
MCV: 87.2
MCV: 87.5
Platelets: 159
RBC: 4.02 — ABNORMAL LOW
RBC: 4.61
RDW: 15.9 — ABNORMAL HIGH
RDW: 15.9 — ABNORMAL HIGH

## 2010-11-10 LAB — URINALYSIS, ROUTINE W REFLEX MICROSCOPIC
Glucose, UA: NEGATIVE
Hgb urine dipstick: NEGATIVE
Ketones, ur: NEGATIVE
Specific Gravity, Urine: 1.03
pH: 5.5

## 2010-11-10 LAB — BODY FLUID CULTURE
Culture: NO GROWTH
Gram Stain: NONE SEEN

## 2010-11-10 LAB — DIFFERENTIAL
Basophils Absolute: 0
Basophils Relative: 0
Basophils Relative: 0
Eosinophils Absolute: 0.2
Eosinophils Relative: 1
Lymphocytes Relative: 10 — ABNORMAL LOW
Lymphs Abs: 0.8
Monocytes Absolute: 0.5
Monocytes Absolute: 1
Neutro Abs: 18.2 — ABNORMAL HIGH
Neutro Abs: 5.7
Neutrophils Relative %: 79 — ABNORMAL HIGH
Neutrophils Relative %: 90 — ABNORMAL HIGH

## 2010-11-10 LAB — GRAM STAIN: Gram Stain: NONE SEEN

## 2010-11-10 LAB — CULTURE, BLOOD (ROUTINE X 2): Culture: NO GROWTH

## 2010-11-10 LAB — URINE MICROSCOPIC-ADD ON

## 2010-11-20 LAB — CBC
HCT: 22.2 — ABNORMAL LOW
HCT: 25.3 — ABNORMAL LOW
HCT: 25.3 — ABNORMAL LOW
HCT: 26.4 — ABNORMAL LOW
Hemoglobin: 8.6 — ABNORMAL LOW
Hemoglobin: 8.8 — ABNORMAL LOW
Hemoglobin: 9.1 — ABNORMAL LOW
MCHC: 34.5
MCHC: 34.8
MCV: 92.6
MCV: 93.3
MCV: 94.5
Platelets: 263
Platelets: 301
Platelets: 316
RBC: 2.71 — ABNORMAL LOW
RBC: 2.73 — ABNORMAL LOW
RBC: 2.78 — ABNORMAL LOW
RBC: 2.87 — ABNORMAL LOW
RDW: 16.5 — ABNORMAL HIGH
RDW: 16.9 — ABNORMAL HIGH
WBC: 3.9 — ABNORMAL LOW
WBC: 4.5
WBC: 4.9
WBC: 4.9

## 2010-11-20 LAB — COMPREHENSIVE METABOLIC PANEL
ALT: 38
AST: 26
Albumin: 1.9 — ABNORMAL LOW
Alkaline Phosphatase: 179 — ABNORMAL HIGH
Calcium: 8.6
Chloride: 107
Chloride: 108
Creatinine, Ser: 0.68
GFR calc Af Amer: >60
Glucose, Bld: 120 — ABNORMAL HIGH
Potassium: 3.2 — ABNORMAL LOW
Sodium: 137
Sodium: 139
Total Bilirubin: 2 — ABNORMAL HIGH
Total Protein: 4.1 — ABNORMAL LOW

## 2010-11-20 LAB — CROSSMATCH
ABO/RH(D): A NEG
Antibody Screen: NEGATIVE

## 2010-11-20 LAB — DIFFERENTIAL
Basophils Relative: 1
Lymphs Abs: 0.6 — ABNORMAL LOW
Monocytes Relative: 9
Neutro Abs: 3.6
Neutrophils Relative %: 74

## 2010-11-20 LAB — BASIC METABOLIC PANEL
BUN: 4 — ABNORMAL LOW
CO2: 26
Chloride: 107
Chloride: 108
GFR calc Af Amer: >60
Potassium: 3.4 — ABNORMAL LOW
Sodium: 137
Sodium: 138

## 2010-11-20 LAB — H. PYLORI ANTIBODY, IGG: H Pylori IgG: 0.7

## 2010-11-20 LAB — IRON AND TIBC
Saturation Ratios: 54
TIBC: 119 — ABNORMAL LOW

## 2010-11-20 LAB — RETICULOCYTES
Retic Count, Absolute: 172.9
Retic Ct Pct: 6.4 — ABNORMAL HIGH

## 2010-11-20 LAB — ALBUMIN: Albumin: 1.7 — ABNORMAL LOW

## 2010-11-20 LAB — HEMOGLOBIN AND HEMATOCRIT, BLOOD
HCT: 21.1 — ABNORMAL LOW
HCT: 25.2 — ABNORMAL LOW
HCT: 26.5 — ABNORMAL LOW

## 2010-11-20 LAB — SAVE SMEAR

## 2010-11-20 LAB — FERRITIN: Ferritin: 404 — ABNORMAL HIGH (ref 22–322)

## 2010-11-20 LAB — APTT: aPTT: 28

## 2010-11-23 LAB — COMPREHENSIVE METABOLIC PANEL
ALT: 42
ALT: 45
ALT: 46
ALT: 48
AST: 41 — ABNORMAL HIGH
AST: 44 — ABNORMAL HIGH
AST: 49 — ABNORMAL HIGH
Albumin: 2 — ABNORMAL LOW
Albumin: 2.1 — ABNORMAL LOW
Alkaline Phosphatase: 101
Alkaline Phosphatase: 192 — ABNORMAL HIGH
Alkaline Phosphatase: 217 — ABNORMAL HIGH
Alkaline Phosphatase: 68
BUN: 25 — ABNORMAL HIGH
BUN: 9
CO2: 25
CO2: 26
CO2: 27
CO2: 27
Calcium: 8.1 — ABNORMAL LOW
Calcium: 8.2 — ABNORMAL LOW
Calcium: 8.2 — ABNORMAL LOW
Calcium: 8.3 — ABNORMAL LOW
Calcium: 8.3 — ABNORMAL LOW
Chloride: 102
Chloride: 103
Chloride: 106
Chloride: 110
Creatinine, Ser: 0.69
Creatinine, Ser: 0.73
Creatinine, Ser: 0.83
GFR calc Af Amer: >60
GFR calc Af Amer: >60
GFR calc non Af Amer: >60
GFR calc non Af Amer: >60
GFR calc non Af Amer: >60
Glucose, Bld: 101 — ABNORMAL HIGH
Glucose, Bld: 115 — ABNORMAL HIGH
Glucose, Bld: 125 — ABNORMAL HIGH
Glucose, Bld: 95
Potassium: 3.8
Potassium: 4
Potassium: 4.3
Sodium: 134 — ABNORMAL LOW
Sodium: 135
Sodium: 136
Total Bilirubin: 2.8 — ABNORMAL HIGH
Total Bilirubin: 4 — ABNORMAL HIGH
Total Bilirubin: 4.5 — ABNORMAL HIGH
Total Protein: 4.4 — ABNORMAL LOW

## 2010-11-23 LAB — URINALYSIS, ROUTINE W REFLEX MICROSCOPIC
Hgb urine dipstick: NEGATIVE
Protein, ur: NEGATIVE
Urobilinogen, UA: 4 — ABNORMAL HIGH

## 2010-11-23 LAB — CBC
HCT: 26.2 — ABNORMAL LOW
HCT: 26.5 — ABNORMAL LOW
HCT: 27 — ABNORMAL LOW
HCT: 27.8 — ABNORMAL LOW
HCT: 28.1 — ABNORMAL LOW
HCT: 29.8 — ABNORMAL LOW
HCT: 30.6 — ABNORMAL LOW
HCT: 31.4 — ABNORMAL LOW
HCT: 33 — ABNORMAL LOW
Hemoglobin: 10.3 — ABNORMAL LOW
Hemoglobin: 10.6 — ABNORMAL LOW
Hemoglobin: 8.6 — ABNORMAL LOW
Hemoglobin: 9 — ABNORMAL LOW
Hemoglobin: 9.3 — ABNORMAL LOW
Hemoglobin: 9.5 — ABNORMAL LOW
Hemoglobin: 9.9 — ABNORMAL LOW
MCHC: 33
MCHC: 34
MCHC: 34
MCHC: 34.3
MCHC: 34.3
MCHC: 34.6
MCHC: 34.8
MCV: 100.2 — ABNORMAL HIGH
MCV: 101.1 — ABNORMAL HIGH
MCV: 93.5
MCV: 93.6
MCV: 98.6
MCV: 98.8
Platelets: 292
Platelets: 323
Platelets: 330
Platelets: 373
Platelets: 404 — ABNORMAL HIGH
RBC: 2.65 — ABNORMAL LOW
RBC: 2.85 — ABNORMAL LOW
RBC: 2.89 — ABNORMAL LOW
RBC: 2.91 — ABNORMAL LOW
RBC: 2.97 — ABNORMAL LOW
RBC: 3.07 — ABNORMAL LOW
RBC: 3.27 — ABNORMAL LOW
RBC: 3.36 — ABNORMAL LOW
RDW: 14
RDW: 14.1
RDW: 14.5
RDW: 17.5 — ABNORMAL HIGH
WBC: 12.7 — ABNORMAL HIGH
WBC: 14.1 — ABNORMAL HIGH
WBC: 14.4 — ABNORMAL HIGH
WBC: 5.7
WBC: 6.8
WBC: 8.6
WBC: 9.8

## 2010-11-23 LAB — BASIC METABOLIC PANEL
BUN: 14
BUN: 17
BUN: 39 — ABNORMAL HIGH
BUN: 40 — ABNORMAL HIGH
CO2: 23
CO2: 24
CO2: 27
CO2: 27
CO2: 28
CO2: 28
Calcium: 7.9 — ABNORMAL LOW
Calcium: 8.1 — ABNORMAL LOW
Chloride: 102
Chloride: 103
Chloride: 104
Chloride: 106
Chloride: 111
Creatinine, Ser: 0.61
GFR calc Af Amer: >60
GFR calc Af Amer: >60
GFR calc Af Amer: >60
GFR calc Af Amer: >60
GFR calc non Af Amer: >60
GFR calc non Af Amer: >60
GFR calc non Af Amer: >60
GFR calc non Af Amer: >60
Glucose, Bld: 100 — ABNORMAL HIGH
Glucose, Bld: 105 — ABNORMAL HIGH
Glucose, Bld: 116 — ABNORMAL HIGH
Glucose, Bld: 122 — ABNORMAL HIGH
Potassium: 3 — ABNORMAL LOW
Potassium: 3.5
Potassium: 3.7
Potassium: 3.8
Potassium: 3.8
Potassium: 3.9
Potassium: 4.1
Sodium: 132 — ABNORMAL LOW
Sodium: 139
Sodium: 141
Sodium: 142
Sodium: 144
Sodium: 144

## 2010-11-23 LAB — DIFFERENTIAL
Basophils Absolute: 0
Basophils Absolute: 0
Eosinophils Absolute: 0.2
Eosinophils Relative: 1
Eosinophils Relative: 3
Lymphocytes Relative: 2 — ABNORMAL LOW
Lymphocytes Relative: 3 — ABNORMAL LOW
Lymphocytes Relative: 5 — ABNORMAL LOW
Lymphs Abs: 0.3 — ABNORMAL LOW
Lymphs Abs: 0.4 — ABNORMAL LOW
Monocytes Absolute: 0.6
Monocytes Absolute: 0.7
Monocytes Relative: 4
Monocytes Relative: 7
Neutro Abs: 11.8 — ABNORMAL HIGH
Neutrophils Relative %: 91 — ABNORMAL HIGH
Neutrophils Relative %: 92 — ABNORMAL HIGH

## 2010-11-23 LAB — CULTURE, BLOOD (ROUTINE X 2): Culture: NO GROWTH

## 2010-11-23 LAB — CROSSMATCH

## 2010-11-23 LAB — PHOSPHORUS: Phosphorus: 3.2

## 2010-11-23 LAB — CK TOTAL AND CKMB (NOT AT ARMC)
CK, MB: 2.1
Relative Index: INVALID
Total CK: 17
Total CK: 20
Total CK: 20

## 2010-11-23 LAB — LACTIC ACID, PLASMA: Lactic Acid, Venous: 1.2

## 2010-11-23 LAB — TRIGLYCERIDES
Triglycerides: 184 — ABNORMAL HIGH
Triglycerides: 212 — ABNORMAL HIGH

## 2010-11-23 LAB — TROPONIN I: Troponin I: 0.07 — ABNORMAL HIGH

## 2010-11-23 LAB — HEMOGLOBIN AND HEMATOCRIT, BLOOD
HCT: 27 — ABNORMAL LOW
Hemoglobin: 9.4 — ABNORMAL LOW

## 2010-11-23 LAB — MAGNESIUM
Magnesium: 1.9
Magnesium: 2.9 — ABNORMAL HIGH

## 2010-11-23 LAB — CHOLESTEROL, TOTAL: Cholesterol: 169

## 2010-11-23 LAB — URINE CULTURE
Colony Count: NO GROWTH
Culture: NO GROWTH

## 2010-11-23 LAB — PROTIME-INR
INR: 1.2
Prothrombin Time: 14.4

## 2010-11-23 LAB — CARDIAC PANEL(CRET KIN+CKTOT+MB+TROPI)
CK, MB: 2.5
CK, MB: 3
Relative Index: 0.9
Total CK: 315 — ABNORMAL HIGH

## 2010-11-23 LAB — D-DIMER, QUANTITATIVE: D-Dimer, Quant: 2.98 — ABNORMAL HIGH

## 2010-11-24 LAB — COMPREHENSIVE METABOLIC PANEL
CO2: 24
Calcium: 8.1 — ABNORMAL LOW
Chloride: 104
Creatinine, Ser: 0.99
GFR calc non Af Amer: >60
Glucose, Bld: 114 — ABNORMAL HIGH
Total Bilirubin: 0.9

## 2010-11-24 LAB — BASIC METABOLIC PANEL
BUN: 14
BUN: 43 — ABNORMAL HIGH
BUN: 65 — ABNORMAL HIGH
BUN: 8
CO2: 27
CO2: 27
CO2: 30
Calcium: 7.8 — ABNORMAL LOW
Chloride: 100
Chloride: 104
Chloride: 106
Chloride: 92 — ABNORMAL LOW
Creatinine, Ser: 1.76 — ABNORMAL HIGH
GFR calc Af Amer: 56 — ABNORMAL LOW
GFR calc Af Amer: >60
GFR calc non Af Amer: >60
Glucose, Bld: 142 — ABNORMAL HIGH
Glucose, Bld: 164 — ABNORMAL HIGH
Glucose, Bld: 167 — ABNORMAL HIGH
Potassium: 3.1 — ABNORMAL LOW
Potassium: 3.5
Potassium: 3.7
Potassium: 3.8
Potassium: 3.9
Potassium: 4.1
Sodium: 133 — ABNORMAL LOW
Sodium: 136
Sodium: 137

## 2010-11-24 LAB — CBC
HCT: 29.4 — ABNORMAL LOW
HCT: 32.2 — ABNORMAL LOW
HCT: 42.1
Hemoglobin: 10.3 — ABNORMAL LOW
Hemoglobin: 11.3 — ABNORMAL LOW
Hemoglobin: 14.7
MCHC: 35
MCV: 100.8 — ABNORMAL HIGH
Platelets: 208
RBC: 2.92 — ABNORMAL LOW
RBC: 4.18 — ABNORMAL LOW
RDW: 13
WBC: 10.4
WBC: 10.5

## 2010-11-24 LAB — CULTURE, BLOOD (ROUTINE X 2): Culture: NO GROWTH

## 2010-11-24 LAB — COMPREHENSIVE METABOLIC PANEL WITH GFR
ALT: 25
AST: 24
Albumin: 3 — ABNORMAL LOW
Alkaline Phosphatase: 54
BUN: 12
GFR calc Af Amer: >60
Potassium: 3.4 — ABNORMAL LOW
Sodium: 133 — ABNORMAL LOW
Total Protein: 5 — ABNORMAL LOW

## 2010-11-24 LAB — DIFFERENTIAL
Basophils Absolute: 0.1
Basophils Relative: 1
Eosinophils Absolute: 0.1 — ABNORMAL LOW
Eosinophils Relative: 1
Lymphocytes Relative: 7 — ABNORMAL LOW
Lymphs Abs: 0.8
Monocytes Absolute: 0.7
Monocytes Relative: 6
Neutro Abs: 8.9 — ABNORMAL HIGH
Neutrophils Relative %: 85 — ABNORMAL HIGH

## 2010-11-24 LAB — CLOSTRIDIUM DIFFICILE EIA

## 2010-11-24 LAB — PROTIME-INR
INR: 1
Prothrombin Time: 13.4

## 2010-11-24 LAB — ETHANOL: Alcohol, Ethyl (B): 61 — ABNORMAL HIGH

## 2010-11-24 LAB — HEMOGLOBIN AND HEMATOCRIT, BLOOD
HCT: 40.3
Hemoglobin: 14.1

## 2010-11-24 LAB — APTT: aPTT: 23 — ABNORMAL LOW

## 2011-07-01 ENCOUNTER — Other Ambulatory Visit: Payer: Self-pay | Admitting: Orthopedic Surgery

## 2011-07-09 ENCOUNTER — Encounter (HOSPITAL_COMMUNITY): Payer: Self-pay | Admitting: Pharmacy Technician

## 2011-07-14 ENCOUNTER — Inpatient Hospital Stay (HOSPITAL_COMMUNITY): Admission: RE | Admit: 2011-07-14 | Payer: Medicare PPO | Source: Ambulatory Visit

## 2011-07-14 ENCOUNTER — Other Ambulatory Visit (HOSPITAL_COMMUNITY): Payer: Medicare PPO

## 2011-07-16 ENCOUNTER — Inpatient Hospital Stay (HOSPITAL_COMMUNITY): Admission: RE | Admit: 2011-07-16 | Payer: Medicare PPO | Source: Ambulatory Visit

## 2011-07-23 ENCOUNTER — Ambulatory Visit (HOSPITAL_COMMUNITY): Admission: RE | Admit: 2011-07-23 | Payer: Medicare PPO | Source: Ambulatory Visit | Admitting: Orthopedic Surgery

## 2011-07-23 ENCOUNTER — Encounter (HOSPITAL_COMMUNITY): Admission: RE | Payer: Self-pay | Source: Ambulatory Visit

## 2011-07-23 SURGERY — ARTHROPLASTY, HIP, TOTAL,POSTERIOR APPROACH
Anesthesia: General | Laterality: Right

## 2011-11-17 ENCOUNTER — Ambulatory Visit: Payer: Medicare PPO

## 2011-11-22 ENCOUNTER — Ambulatory Visit: Payer: Medicare PPO | Attending: Family Medicine

## 2011-11-22 DIAGNOSIS — IMO0001 Reserved for inherently not codable concepts without codable children: Secondary | ICD-10-CM | POA: Insufficient documentation

## 2011-11-22 DIAGNOSIS — M25639 Stiffness of unspecified wrist, not elsewhere classified: Secondary | ICD-10-CM | POA: Insufficient documentation

## 2011-11-22 DIAGNOSIS — M25539 Pain in unspecified wrist: Secondary | ICD-10-CM | POA: Insufficient documentation

## 2011-11-23 ENCOUNTER — Encounter: Payer: Medicare PPO | Admitting: Physical Therapy

## 2011-12-01 ENCOUNTER — Ambulatory Visit: Payer: Medicare PPO | Admitting: Physical Therapy

## 2011-12-03 ENCOUNTER — Ambulatory Visit: Payer: Medicare PPO

## 2011-12-09 ENCOUNTER — Ambulatory Visit: Payer: Medicare PPO

## 2011-12-13 ENCOUNTER — Ambulatory Visit: Payer: Medicare PPO

## 2011-12-17 ENCOUNTER — Ambulatory Visit: Payer: Medicare PPO | Attending: Family Medicine

## 2011-12-17 DIAGNOSIS — M25519 Pain in unspecified shoulder: Secondary | ICD-10-CM | POA: Insufficient documentation

## 2011-12-17 DIAGNOSIS — M25619 Stiffness of unspecified shoulder, not elsewhere classified: Secondary | ICD-10-CM | POA: Insufficient documentation

## 2011-12-17 DIAGNOSIS — R5381 Other malaise: Secondary | ICD-10-CM | POA: Insufficient documentation

## 2011-12-17 DIAGNOSIS — IMO0001 Reserved for inherently not codable concepts without codable children: Secondary | ICD-10-CM | POA: Insufficient documentation

## 2011-12-20 ENCOUNTER — Ambulatory Visit: Payer: Medicare PPO | Admitting: Physical Therapy

## 2011-12-22 ENCOUNTER — Ambulatory Visit: Payer: Medicare PPO

## 2011-12-27 ENCOUNTER — Ambulatory Visit: Payer: Medicare PPO

## 2011-12-29 ENCOUNTER — Ambulatory Visit: Payer: Medicare PPO | Admitting: Physical Therapy

## 2012-01-03 ENCOUNTER — Ambulatory Visit: Payer: Medicare PPO | Admitting: Physical Therapy

## 2012-01-05 ENCOUNTER — Ambulatory Visit: Payer: Medicare PPO | Admitting: Physical Therapy

## 2012-06-06 ENCOUNTER — Ambulatory Visit: Payer: Medicare PPO | Attending: Orthopedic Surgery

## 2012-06-06 DIAGNOSIS — M25539 Pain in unspecified wrist: Secondary | ICD-10-CM | POA: Insufficient documentation

## 2012-06-06 DIAGNOSIS — IMO0001 Reserved for inherently not codable concepts without codable children: Secondary | ICD-10-CM | POA: Insufficient documentation

## 2012-06-07 ENCOUNTER — Ambulatory Visit: Payer: Medicare PPO | Admitting: Physical Therapy

## 2012-06-08 ENCOUNTER — Ambulatory Visit: Payer: Medicare PPO | Admitting: Physical Therapy

## 2012-06-12 ENCOUNTER — Ambulatory Visit: Payer: Medicare PPO | Admitting: Physical Therapy

## 2012-06-13 ENCOUNTER — Ambulatory Visit: Payer: Medicare PPO | Admitting: Physical Therapy

## 2012-06-14 ENCOUNTER — Ambulatory Visit: Payer: Medicare PPO

## 2012-06-15 ENCOUNTER — Ambulatory Visit: Payer: Medicare PPO

## 2012-06-15 ENCOUNTER — Encounter: Payer: Medicare PPO | Admitting: Physical Therapy

## 2012-06-16 ENCOUNTER — Ambulatory Visit: Payer: Medicare PPO | Admitting: Physical Therapy

## 2012-06-19 ENCOUNTER — Ambulatory Visit: Payer: Medicare PPO | Attending: Orthopedic Surgery

## 2012-06-19 DIAGNOSIS — IMO0001 Reserved for inherently not codable concepts without codable children: Secondary | ICD-10-CM | POA: Insufficient documentation

## 2012-06-19 DIAGNOSIS — M25539 Pain in unspecified wrist: Secondary | ICD-10-CM | POA: Insufficient documentation

## 2012-06-21 ENCOUNTER — Ambulatory Visit: Payer: Medicare PPO

## 2012-06-23 ENCOUNTER — Ambulatory Visit: Payer: Medicare PPO | Admitting: Physical Therapy

## 2012-06-26 ENCOUNTER — Ambulatory Visit: Payer: Medicare PPO | Admitting: Physical Therapy

## 2012-06-28 ENCOUNTER — Ambulatory Visit: Payer: Medicare PPO | Admitting: Physical Therapy

## 2012-06-29 ENCOUNTER — Ambulatory Visit: Payer: Medicare PPO | Admitting: Physical Therapy

## 2012-06-30 ENCOUNTER — Encounter (HOSPITAL_COMMUNITY): Payer: Self-pay | Admitting: Psychiatry

## 2012-06-30 NOTE — Telephone Encounter (Signed)
Erroroneous encounter, wrong patient.

## 2012-07-03 ENCOUNTER — Ambulatory Visit: Payer: Medicare PPO | Admitting: Physical Therapy

## 2012-07-05 ENCOUNTER — Encounter: Payer: Medicare PPO | Admitting: Physical Therapy

## 2012-07-06 ENCOUNTER — Ambulatory Visit: Payer: Medicare PPO

## 2012-07-11 ENCOUNTER — Ambulatory Visit: Payer: Medicare PPO | Admitting: Physical Therapy

## 2012-07-13 ENCOUNTER — Ambulatory Visit: Payer: Medicare PPO | Admitting: Physical Therapy

## 2012-07-17 ENCOUNTER — Ambulatory Visit: Payer: Medicare PPO | Attending: Orthopedic Surgery | Admitting: Physical Therapy

## 2012-07-17 DIAGNOSIS — R269 Unspecified abnormalities of gait and mobility: Secondary | ICD-10-CM | POA: Insufficient documentation

## 2012-07-17 DIAGNOSIS — R609 Edema, unspecified: Secondary | ICD-10-CM | POA: Insufficient documentation

## 2012-07-17 DIAGNOSIS — IMO0001 Reserved for inherently not codable concepts without codable children: Secondary | ICD-10-CM | POA: Insufficient documentation

## 2012-07-17 DIAGNOSIS — M25569 Pain in unspecified knee: Secondary | ICD-10-CM | POA: Insufficient documentation

## 2012-07-17 DIAGNOSIS — M6281 Muscle weakness (generalized): Secondary | ICD-10-CM | POA: Insufficient documentation

## 2012-07-19 ENCOUNTER — Ambulatory Visit: Payer: Medicare PPO | Admitting: Physical Therapy

## 2012-07-24 ENCOUNTER — Ambulatory Visit: Payer: Medicare PPO | Admitting: Physical Therapy

## 2012-07-26 ENCOUNTER — Ambulatory Visit: Payer: Medicare PPO

## 2012-07-31 ENCOUNTER — Ambulatory Visit: Payer: Medicare PPO | Admitting: Physical Therapy

## 2012-08-02 ENCOUNTER — Ambulatory Visit: Payer: Medicare PPO | Admitting: Physical Therapy

## 2012-08-07 ENCOUNTER — Encounter: Payer: Medicare PPO | Admitting: Physical Therapy

## 2012-08-14 ENCOUNTER — Ambulatory Visit: Payer: Medicare PPO

## 2012-08-16 ENCOUNTER — Encounter: Payer: Medicare PPO | Admitting: Physical Therapy

## 2012-08-17 ENCOUNTER — Ambulatory Visit: Payer: Medicare PPO | Attending: Orthopedic Surgery | Admitting: Physical Therapy

## 2012-08-17 DIAGNOSIS — R5381 Other malaise: Secondary | ICD-10-CM | POA: Insufficient documentation

## 2012-08-17 DIAGNOSIS — M25519 Pain in unspecified shoulder: Secondary | ICD-10-CM | POA: Insufficient documentation

## 2012-08-17 DIAGNOSIS — M25619 Stiffness of unspecified shoulder, not elsewhere classified: Secondary | ICD-10-CM | POA: Insufficient documentation

## 2012-08-17 DIAGNOSIS — IMO0001 Reserved for inherently not codable concepts without codable children: Secondary | ICD-10-CM | POA: Insufficient documentation

## 2012-08-21 ENCOUNTER — Ambulatory Visit: Payer: Medicare PPO | Admitting: Physical Therapy

## 2012-08-23 ENCOUNTER — Ambulatory Visit: Payer: Medicare PPO | Admitting: Physical Therapy

## 2012-08-30 ENCOUNTER — Encounter: Payer: Medicare PPO | Admitting: Physical Therapy

## 2012-09-04 ENCOUNTER — Ambulatory Visit: Payer: Medicare PPO | Admitting: Physical Therapy

## 2012-09-06 ENCOUNTER — Ambulatory Visit: Payer: Medicare PPO | Admitting: Physical Therapy

## 2012-09-11 ENCOUNTER — Encounter: Payer: Medicare PPO | Admitting: Physical Therapy

## 2012-10-03 ENCOUNTER — Ambulatory Visit: Payer: Medicare PPO | Admitting: Family Medicine

## 2012-12-19 ENCOUNTER — Encounter: Payer: Self-pay | Admitting: Interventional Cardiology

## 2012-12-19 ENCOUNTER — Ambulatory Visit: Payer: Medicare PPO | Admitting: Interventional Cardiology

## 2013-01-12 ENCOUNTER — Encounter: Payer: Self-pay | Admitting: Interventional Cardiology

## 2013-12-15 ENCOUNTER — Encounter: Payer: Self-pay | Admitting: *Deleted

## 2014-02-12 ENCOUNTER — Encounter: Payer: Self-pay | Admitting: Interventional Cardiology

## 2014-02-13 ENCOUNTER — Encounter: Payer: Self-pay | Admitting: Interventional Cardiology

## 2014-04-04 ENCOUNTER — Telehealth: Payer: Self-pay

## 2014-04-04 NOTE — Telephone Encounter (Signed)
Pt of Dr. Perrin MalteseGuest states he had a procedure today, and found out that he has an ulcer, and some issues with stomach acid. 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 He states that he was given medication for this, and wanted Dr. Perrin MalteseGuest to be aware of this. Please give Patient a call.

## 2015-06-15 ENCOUNTER — Telehealth: Payer: Self-pay

## 2017-02-15 DEATH — deceased
